# Patient Record
Sex: Female | Born: 1940 | Race: Black or African American | Hispanic: No | Smoking: Never smoker
Health system: Southern US, Community
[De-identification: ages and names within clinical notes are randomized; demographics above are authoritative.]

## PROBLEM LIST (undated history)

## (undated) DIAGNOSIS — E119 Type 2 diabetes mellitus without complications: Secondary | ICD-10-CM

## (undated) DIAGNOSIS — I10 Essential (primary) hypertension: Secondary | ICD-10-CM

---

## 2014-08-18 ENCOUNTER — Encounter (HOSPITAL_BASED_OUTPATIENT_CLINIC_OR_DEPARTMENT_OTHER): Payer: Self-pay | Admitting: *Deleted

## 2014-08-18 ENCOUNTER — Emergency Department (HOSPITAL_BASED_OUTPATIENT_CLINIC_OR_DEPARTMENT_OTHER): Payer: Medicare HMO

## 2014-08-18 ENCOUNTER — Emergency Department (HOSPITAL_BASED_OUTPATIENT_CLINIC_OR_DEPARTMENT_OTHER)
Admission: EM | Admit: 2014-08-18 | Discharge: 2014-08-18 | Disposition: A | Discharge status: Home / Self Care | Payer: Medicare HMO | Attending: Emergency Medicine | Admitting: Emergency Medicine

## 2014-08-18 DIAGNOSIS — E119 Type 2 diabetes mellitus without complications: Secondary | ICD-10-CM | POA: Diagnosis not present

## 2014-08-18 DIAGNOSIS — R51 Headache: Secondary | ICD-10-CM | POA: Diagnosis not present

## 2014-08-18 DIAGNOSIS — Z79899 Other long term (current) drug therapy: Secondary | ICD-10-CM | POA: Insufficient documentation

## 2014-08-18 DIAGNOSIS — I10 Essential (primary) hypertension: Secondary | ICD-10-CM | POA: Insufficient documentation

## 2014-08-18 DIAGNOSIS — R519 Headache, unspecified: Secondary | ICD-10-CM

## 2014-08-18 HISTORY — DX: Essential (primary) hypertension: I10

## 2014-08-18 HISTORY — DX: Type 2 diabetes mellitus without complications: E11.9

## 2014-08-18 LAB — CBC WITH DIFFERENTIAL/PLATELET
BASOS ABS: 0 10*3/uL (ref 0.0–0.1)
BASOS PCT: 0 % (ref 0–1)
Eosinophils Absolute: 0.1 10*3/uL (ref 0.0–0.7)
Eosinophils Relative: 2 % (ref 0–5)
HCT: 36.8 % (ref 36.0–46.0)
Hemoglobin: 12.3 g/dL (ref 12.0–15.0)
LYMPHS ABS: 1.7 10*3/uL (ref 0.7–4.0)
LYMPHS PCT: 31 % (ref 12–46)
MCH: 28.7 pg (ref 26.0–34.0)
MCHC: 33.4 g/dL (ref 30.0–36.0)
MCV: 85.8 fL (ref 78.0–100.0)
MONO ABS: 0.4 10*3/uL (ref 0.1–1.0)
MONOS PCT: 7 % (ref 3–12)
NEUTROS ABS: 3.4 10*3/uL (ref 1.7–7.7)
Neutrophils Relative %: 60 % (ref 43–77)
Platelets: 208 10*3/uL (ref 150–400)
RBC: 4.29 MIL/uL (ref 3.87–5.11)
RDW: 13.2 % (ref 11.5–15.5)
WBC: 5.6 10*3/uL (ref 4.0–10.5)

## 2014-08-18 LAB — BASIC METABOLIC PANEL
ANION GAP: 9 (ref 5–15)
BUN: 13 mg/dL (ref 6–23)
CO2: 25 mmol/L (ref 19–32)
CREATININE: 0.82 mg/dL (ref 0.50–1.10)
Calcium: 9.6 mg/dL (ref 8.4–10.5)
Chloride: 107 mmol/L (ref 96–112)
GFR calc Af Amer: 79 mL/min — ABNORMAL LOW (ref >90)
GFR calc non Af Amer: 68 mL/min — ABNORMAL LOW (ref >90)
Glucose, Bld: 130 mg/dL — ABNORMAL HIGH (ref 70–99)
Potassium: 3.6 mmol/L (ref 3.5–5.1)
Sodium: 141 mmol/L (ref 135–145)

## 2014-08-18 LAB — TROPONIN I: Troponin I: <0.03 ng/mL (ref <0.031)

## 2014-08-18 MED ORDER — METOCLOPRAMIDE HCL 5 MG/ML IJ SOLN
10.0000 mg | Freq: Once | INTRAMUSCULAR | Status: AC
  Administered 2014-08-18: 10 mg via INTRAVENOUS
  Filled 2014-08-18: qty 2

## 2014-08-18 MED ORDER — DIPHENHYDRAMINE HCL 50 MG/ML IJ SOLN
25.0000 mg | Freq: Once | INTRAMUSCULAR | Status: AC
  Administered 2014-08-18: 25 mg via INTRAVENOUS
  Filled 2014-08-18: qty 1

## 2014-08-18 MED ORDER — DEXAMETHASONE SODIUM PHOSPHATE 10 MG/ML IJ SOLN
10.0000 mg | Freq: Once | INTRAMUSCULAR | Status: AC
  Administered 2014-08-18: 10 mg via INTRAVENOUS
  Filled 2014-08-18: qty 1

## 2014-08-18 NOTE — ED Notes (Signed)
Pt states she is having personal problems and it is running her BP up. Has been to ED x 2 and MD x 2 and unable to keep BP down. States MD has changed one of her meds. States she got upset today and saw halos and top of head hurt and she checked her BP and it was SBP 180. States only sx now is pressure on top of head.

## 2014-08-18 NOTE — ED Provider Notes (Signed)
CSN: [id number]     Arrival date & time 08/18/14  1745 History  This chart was scribed for Geoffery Lyonsouglas Shenoa Hattabaugh, MD by Modena JanskyAlbert Thayil, ED Scribe. This patient was seen in room MH11/MH11 and the patient's care was started at 6:51 PM.    Chief Complaint  Patient presents with  . Hypertension   Patient is a 74 y.o. female presenting with hypertension. The history is provided by the patient. No language interpreter was used.  Hypertension This is a recurrent problem. The current episode started 3 to 5 hours ago. The problem has been gradually improving. Pertinent negatives include no chest pain and no shortness of breath. Nothing aggravates the symptoms. Nothing relieves the symptoms. She has tried nothing for the symptoms.   HPI Comments: Hannah KirksRosa Hatfield is a 74 y.o. female with a hx of HTN who presents to the Emergency Department complaining of an episode of hypertension that occurred today. She reports that her blood pressure has been running high with a constant moderate headache today. She states that her blood pressure at home was 187/90. Pt's blood pressure in the ED is 162/76. She reports that she is currently on HTN medication. She denies any recent head injury of hx of heart problems. She also denies any chest pain or SOB.   Past Medical History  Diagnosis Date  . Hypertension   . Diabetes mellitus without complication    History reviewed. No pertinent past surgical history. No family history on file. History  Substance Use Topics  . Smoking status: Never Smoker   . Smokeless tobacco: Not on file  . Alcohol Use: Not on file   OB History    No data available     Review of Systems  Respiratory: Negative for shortness of breath.   Cardiovascular: Negative for chest pain.  All other systems reviewed and are negative.    Allergies  Metoprolol tartrate; Triamterene; Claritin; and Zithromax  Home Medications   Prior to Admission medications   Medication Sig Start Date End Date Taking?  Authorizing Provider  amLODipine (NORVASC) 10 MG tablet Take 10 mg by mouth daily.   Yes Historical Provider, MD  valsartan (DIOVAN) 160 MG tablet Take 160 mg by mouth daily.   Yes Historical Provider, MD   BP 162/76 mmHg  Pulse 90  Temp(Src) 98.8 F (37.1 C) (Oral)  Resp 18  Ht 5\' 6"  (1.676 m)  Wt 175 lb (79.379 kg)  BMI 28.26 kg/m2 Physical Exam  Constitutional: She is oriented to person, place, and time. She appears well-developed and well-nourished. No distress.  HENT:  Head: Normocephalic and atraumatic.  Mouth/Throat: Oropharynx is clear and moist. No oropharyngeal exudate.  Eyes: Conjunctivae and EOM are normal. Pupils are equal, round, and reactive to light.  Neck: Normal range of motion. Neck supple.  Cardiovascular: Normal rate, regular rhythm, normal heart sounds and intact distal pulses.   No murmur heard. Pulmonary/Chest: Effort normal and breath sounds normal. No respiratory distress.  Abdominal: Soft. There is no tenderness. There is no rebound and no guarding.  Musculoskeletal: Normal range of motion. She exhibits no edema or tenderness.  Neurological: She is alert and oriented to person, place, and time. No cranial nerve deficit. She exhibits normal muscle tone. Coordination normal.  Skin: Skin is warm.  Psychiatric: She has a normal mood and affect. Her behavior is normal.  Nursing note and vitals reviewed.   ED Course  Procedures (including critical care time) DIAGNOSTIC STUDIES: Oxygen Saturation is 97% on RA, normal by  my interpretation.    COORDINATION OF CARE: 6:55 PM- Pt advised of plan for treatment which includes medication, radiology, and labs and pt agrees.  Labs Review Labs Reviewed - No data to display  Imaging Review No results found.   EKG Interpretation   Date/Time:  Saturday August 18 2014 19:33:40 EDT Ventricular Rate:  68 PR Interval:  174 QRS Duration: 86 QT Interval:  402 QTC Calculation: 427 R Axis:   57 Text Interpretation:   Normal sinus rhythm Nonspecific ST and T wave  abnormality Abnormal ECG Confirmed by DELOS  MD, Rozann Holts ([id number]) on  08/18/2014 9:27:43 PM      MDM   Final diagnoses:  None    Patient is a 74 year old female who presents with complaints of headache and elevated blood pressure. Her physical examination is essentially unremarkable and neurologic exam is unremarkable. CT scan of the head reveals no acute process. EKG and laboratory studies failed to reveal any evidence of end organ damage. She is feeling better with a migraine cocktail and I believe is appropriate for discharge. Her blood pressure is now 122/68 without intervention. I have advised her to keep a record of her blood pressures which she can take with her to her next doctor's appointment.  I personally performed the services described in this documentation, which was scribed in my presence. The recorded information has been reviewed and is accurate.       Geoffery Lyons, MD 08/18/14 2129

## 2014-08-18 NOTE — Discharge Instructions (Signed)
Continue your medications as before.  Keep a record of your blood pressures which he can take with you to your next doctor's appointment to discuss.  Return to the emergency department if your symptoms significantly worsen or change.   Hypertension Hypertension, commonly called high blood pressure, is when the force of blood pumping through your arteries is too strong. Your arteries are the blood vessels that carry blood from your heart throughout your body. A blood pressure reading consists of a higher number over a lower number, such as 110/72. The higher number (systolic) is the pressure inside your arteries when your heart pumps. The lower number (diastolic) is the pressure inside your arteries when your heart relaxes. Ideally you want your blood pressure below 120/80. Hypertension forces your heart to work harder to pump blood. Your arteries may become narrow or stiff. Having hypertension puts you at risk for heart disease, stroke, and other problems.  RISK FACTORS Some risk factors for high blood pressure are controllable. Others are not.  Risk factors you cannot control include:   Race. You may be at higher risk if you are African American.  Age. Risk increases with age.  Gender. Men are at higher risk than women before age 76 years. After age 76, women are at higher risk than men. Risk factors you can control include:  Not getting enough exercise or physical activity.  Being overweight.  Getting too much fat, sugar, calories, or salt in your diet.  Drinking too much alcohol. SIGNS AND SYMPTOMS Hypertension does not usually cause signs or symptoms. Extremely high blood pressure (hypertensive crisis) may cause headache, anxiety, shortness of breath, and nosebleed. DIAGNOSIS  To check if you have hypertension, your health care provider will measure your blood pressure while you are seated, with your arm held at the level of your heart. It should be measured at least twice using  the same arm. Certain conditions can cause a difference in blood pressure between your right and left arms. A blood pressure reading that is higher than normal on one occasion does not mean that you need treatment. If one blood pressure reading is high, ask your health care provider about having it checked again. TREATMENT  Treating high blood pressure includes making lifestyle changes and possibly taking medicine. Living a healthy lifestyle can help lower high blood pressure. You may need to change some of your habits. Lifestyle changes may include:  Following the DASH diet. This diet is high in fruits, vegetables, and whole grains. It is low in salt, red meat, and added sugars.  Getting at least 2 hours of brisk physical activity every week.  Losing weight if necessary.  Not smoking.  Limiting alcoholic beverages.  Learning ways to reduce stress. If lifestyle changes are not enough to get your blood pressure under control, your health care provider may prescribe medicine. You may need to take more than one. Work closely with your health care provider to understand the risks and benefits. HOME CARE INSTRUCTIONS  Have your blood pressure rechecked as directed by your health care provider.   Take medicines only as directed by your health care provider. Follow the directions carefully. Blood pressure medicines must be taken as prescribed. The medicine does not work as well when you skip doses. Skipping doses also puts you at risk for problems.   Do not smoke.   Monitor your blood pressure at home as directed by your health care provider. SEEK MEDICAL CARE IF:   You think you are  having a reaction to medicines taken.  You have recurrent headaches or feel dizzy.  You have swelling in your ankles.  You have trouble with your vision. SEEK IMMEDIATE MEDICAL CARE IF:  You develop a severe headache or confusion.  You have unusual weakness, numbness, or feel faint.  You have  severe chest or abdominal pain.  You vomit repeatedly.  You have trouble breathing. MAKE SURE YOU:   Understand these instructions.  Will watch your condition.  Will get help right away if you are not doing well or get worse. Document Released: 05/04/2005 Document Revised: 09/18/2013 Document Reviewed: 02/24/2013 [facility] Patient Information 2015 [place], [place]. This information is not intended to replace advice given to you by your health care provider. Make sure you discuss any questions you have with your health care provider.  General Headache Without Cause A headache is pain or discomfort felt around the head or neck area. The specific cause of a headache may not be found. There are many causes and types of headaches. A few common ones are:  Tension headaches.  Migraine headaches.  Cluster headaches.  Chronic daily headaches. HOME CARE INSTRUCTIONS   Keep all follow-up appointments with your caregiver or any specialist referral.  Only take over-the-counter or prescription medicines for pain or discomfort as directed by your caregiver.  Lie down in a dark, quiet room when you have a headache.  Keep a headache journal to find out what may trigger your migraine headaches. For example, write down:  What you eat and drink.  How much sleep you get.  Any change to your diet or medicines.  Try massage or other relaxation techniques.  Put ice packs or heat on the head and neck. Use these 3 to 4 times per day for 15 to 20 minutes each time, or as needed.  Limit stress.  Sit up straight, and do not tense your muscles.  Quit smoking if you smoke.  Limit alcohol use.  Decrease the amount of caffeine you drink, or stop drinking caffeine.  Eat and sleep on a regular schedule.  Get 7 to 9 hours of sleep, or as recommended by your caregiver.  Keep lights dim if bright lights bother you and make your headaches worse. SEEK MEDICAL CARE IF:   You have problems with the  medicines you were prescribed.  Your medicines are not working.  You have a change from the usual headache.  You have nausea or vomiting. SEEK IMMEDIATE MEDICAL CARE IF:   Your headache becomes severe.  You have a fever.  You have a stiff neck.  You have loss of vision.  You have muscular weakness or loss of muscle control.  You start losing your balance or have trouble walking.  You feel faint or pass out.  You have severe symptoms that are different from your first symptoms. MAKE SURE YOU:   Understand these instructions.  Will watch your condition.  Will get help right away if you are not doing well or get worse. Document Released: 05/04/2005 Document Revised: 07/27/2011 Document Reviewed: 05/20/2011 [facility] Patient Information 2015 [place], [place]. This information is not intended to replace advice given to you by your health care provider. Make sure you discuss any questions you have with your health care provider.

## 2016-03-14 IMAGING — CT CT HEAD W/O CM
1 series · 16 of 30 positions shown, 20 images · non-contrast
Comparison: 08/12/2014.

CLINICAL DATA: Hypertension today with constant moderate headache
today, initial encounter.

EXAM:
CT HEAD WITHOUT CONTRAST
TECHNIQUE: Contiguous axial images were obtained from the base of the skull
through the vertex without intravenous contrast.

[Series 2: head 4.8 h37s · axial · 0.44mm/px · z∈[-143,-10]mm · 16 of 32 slices shown, 20 images]
[im 2/32  brain]
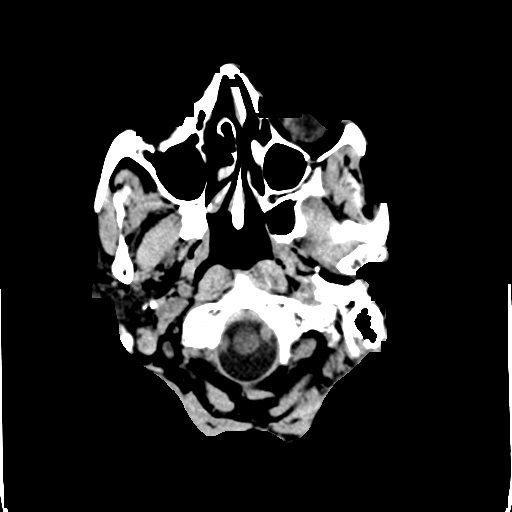
[im 2/32  bone]
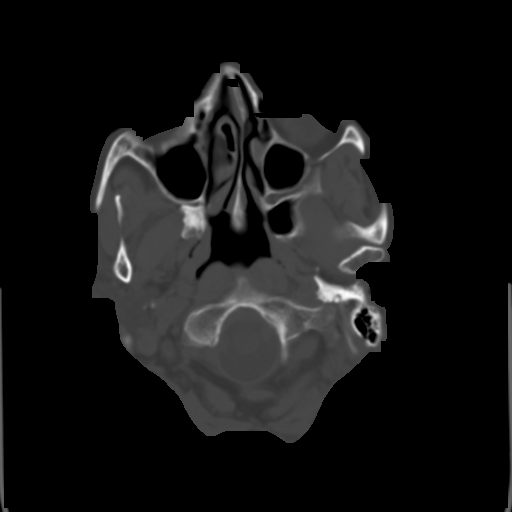
[im 4/32  brain]
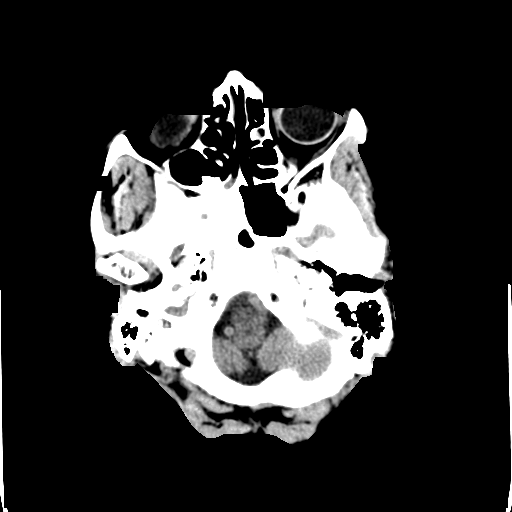
[im 6/32  brain]
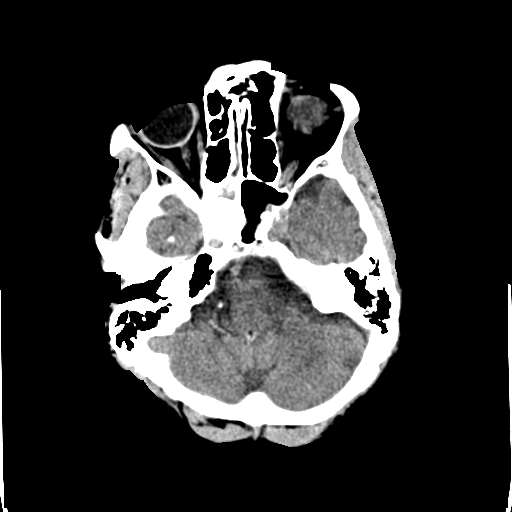
[im 8/32  brain]
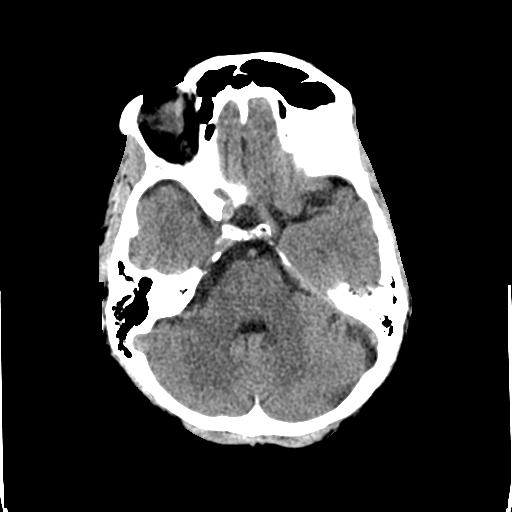
[im 9/32  brain]
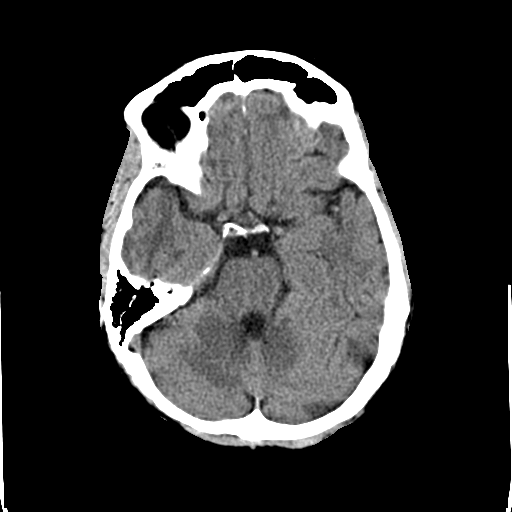
[im 9/32  bone]
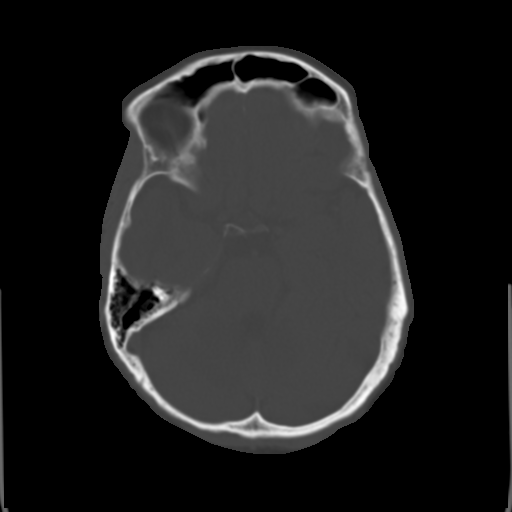
[im 11/32  brain]
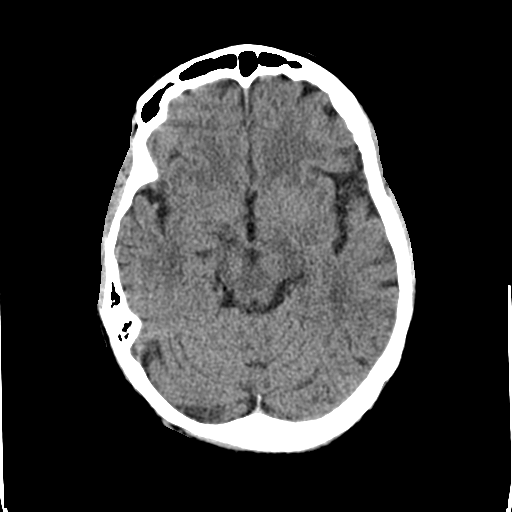
[im 13/32  brain]
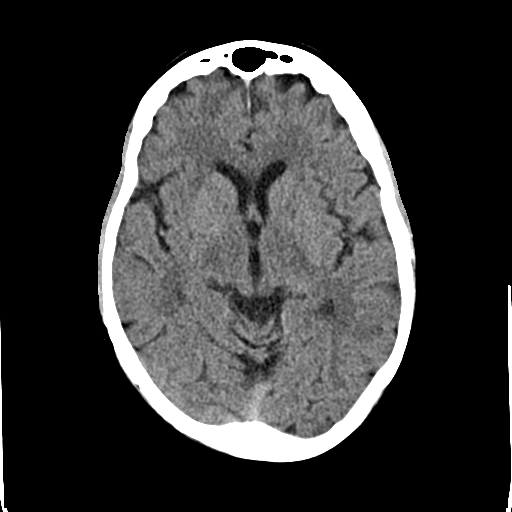
[im 15/32  brain]
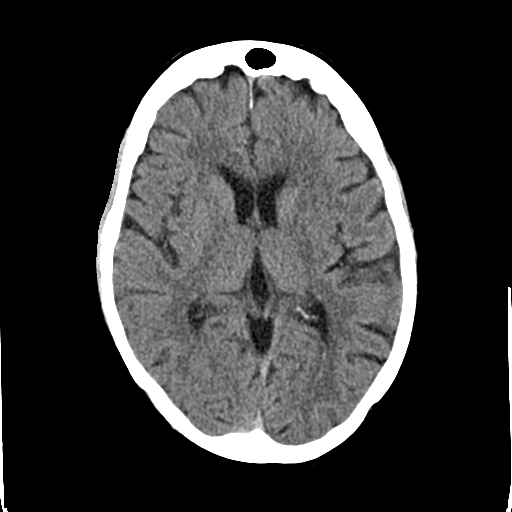
[im 17/32  brain]
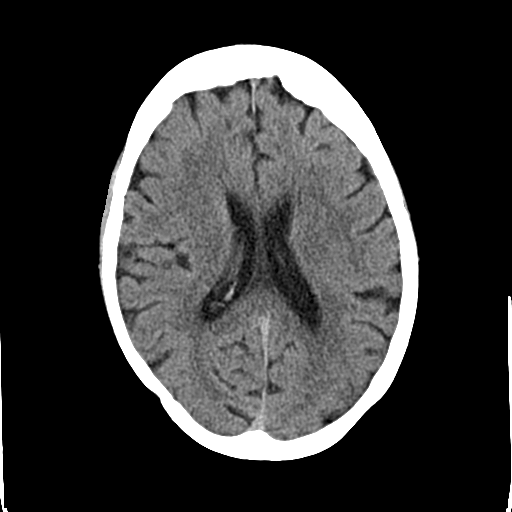
[im 17/32  bone]
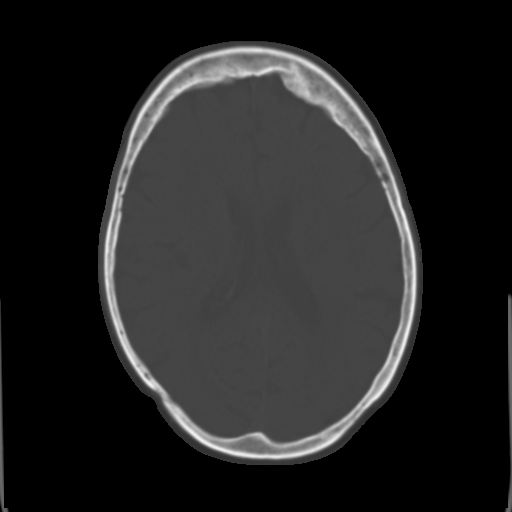
[im 19/32  brain]
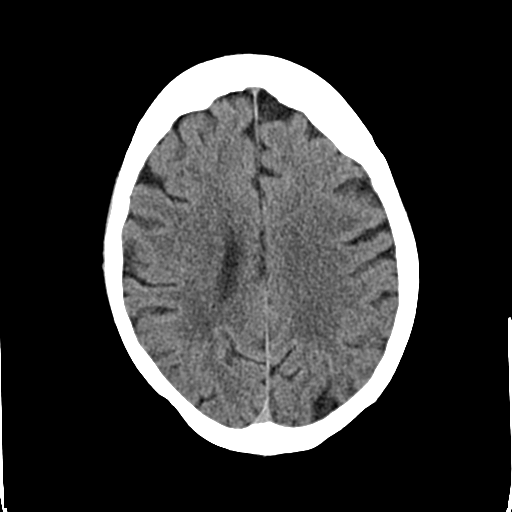
[im 21/32  brain]
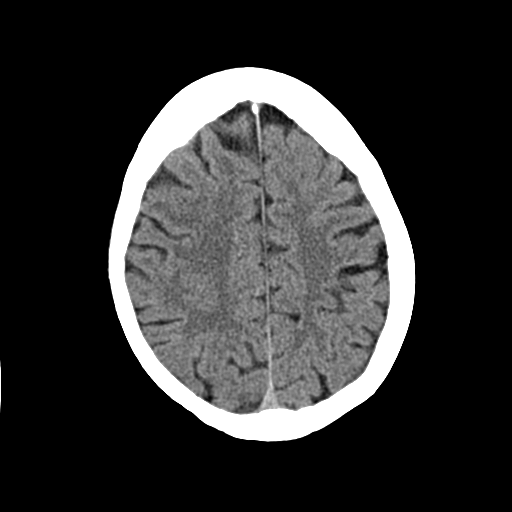
[im 23/32  brain]
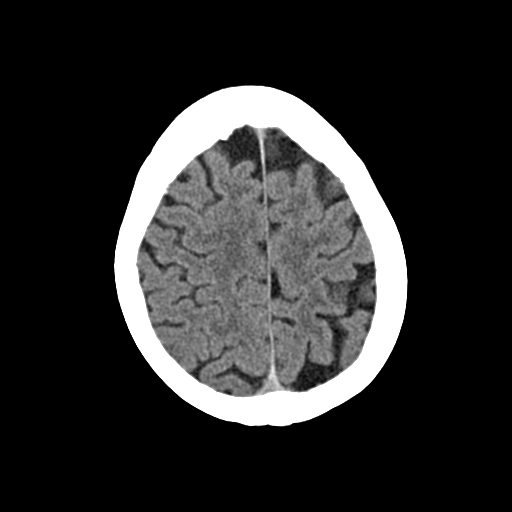
[im 24/32  brain]
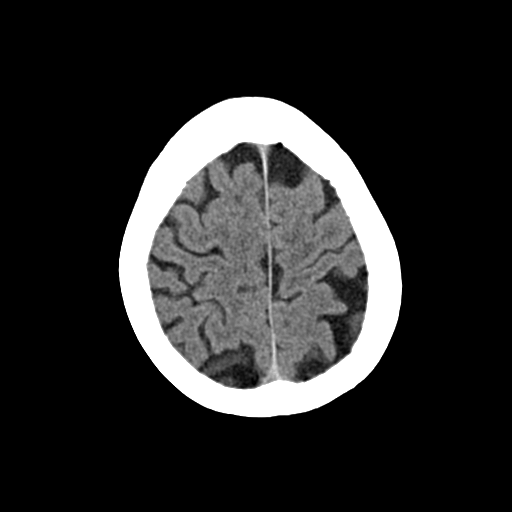
[im 24/32  bone]
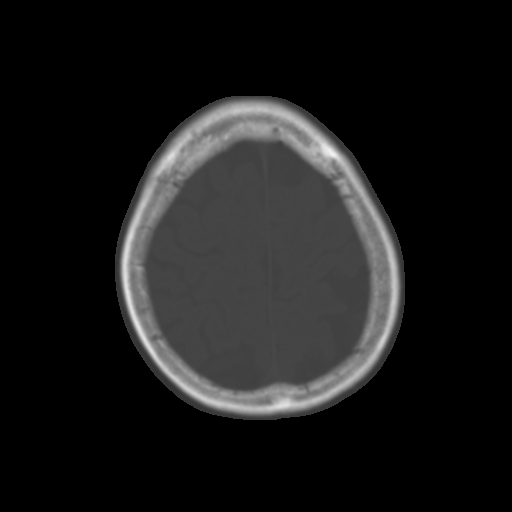
[im 26/32  brain]
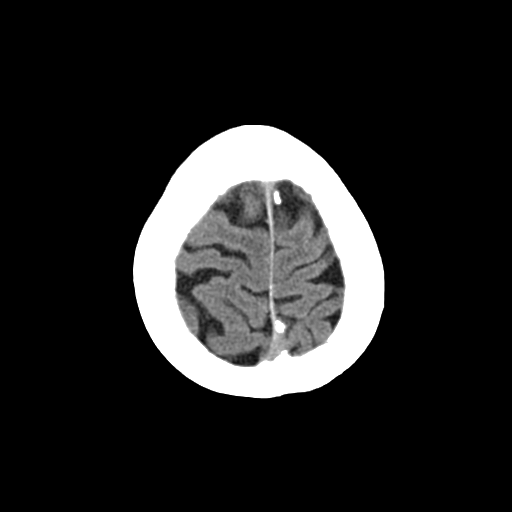
[im 28/32  brain]
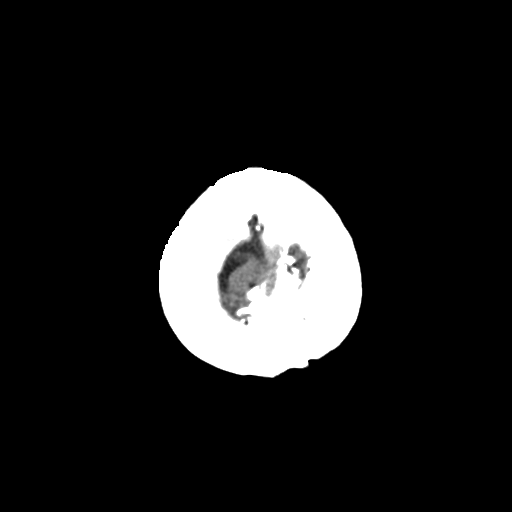
[im 30/32  brain]
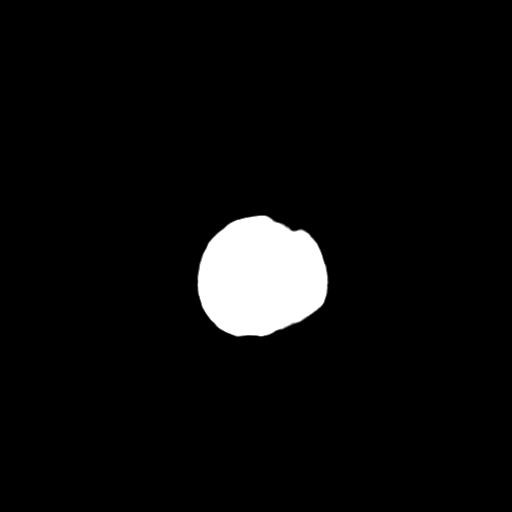

[16 of 30 positions shown; findings below may reference images not displayed]

FINDINGS: No evidence of an acute infarct, acute hemorrhage, mass lesion, mass
effect or hydrocephalus. Atrophy. Mild periventricular low
attenuation. Scattered opacification of ethmoid air cells. Right
sphenoid sinus is completely opacified with thickening of the
surrounding bone.
IMPRESSION: 1. No acute intracranial abnormality.
2. Atrophy and mild chronic microvascular white matter ischemic
changes.
3. Paranasal sinus inflammatory changes appear chronic.
